# Patient Record
Sex: Male | Born: 1960
Health system: Southern US, Community
[De-identification: ages and names within clinical notes are randomized; demographics above are authoritative.]

## PROBLEM LIST (undated history)

## (undated) DIAGNOSIS — Z9889 Other specified postprocedural states: Secondary | ICD-10-CM

## (undated) DIAGNOSIS — R112 Nausea with vomiting, unspecified: Secondary | ICD-10-CM

## (undated) DIAGNOSIS — K4090 Unilateral inguinal hernia, without obstruction or gangrene, not specified as recurrent: Secondary | ICD-10-CM

## (undated) DIAGNOSIS — S83419A Sprain of medial collateral ligament of unspecified knee, initial encounter: Secondary | ICD-10-CM

## (undated) HISTORY — PX: INGUINAL HERNIA REPAIR: SUR1180

---

## 2002-07-26 ENCOUNTER — Ambulatory Visit (HOSPITAL_COMMUNITY): Admission: RE | Admit: 2002-07-26 | Discharge: 2002-07-26 | Payer: Self-pay | Admitting: Surgery

## 2011-07-27 ENCOUNTER — Inpatient Hospital Stay (INDEPENDENT_AMBULATORY_CARE_PROVIDER_SITE_OTHER)
Admission: RE | Admit: 2011-07-27 | Discharge: 2011-07-27 | Disposition: A | Discharge status: Home / Self Care | Payer: 59 | Source: Ambulatory Visit | Attending: Family Medicine | Admitting: Family Medicine

## 2011-07-27 DIAGNOSIS — IMO0002 Reserved for concepts with insufficient information to code with codable children: Secondary | ICD-10-CM

## 2016-01-30 DIAGNOSIS — R1909 Other intra-abdominal and pelvic swelling, mass and lump: Secondary | ICD-10-CM | POA: Diagnosis not present

## 2016-02-22 ENCOUNTER — Other Ambulatory Visit: Payer: Self-pay | Admitting: Surgery

## 2016-02-22 DIAGNOSIS — K4091 Unilateral inguinal hernia, without obstruction or gangrene, recurrent: Secondary | ICD-10-CM

## 2016-03-21 ENCOUNTER — Ambulatory Visit
Admission: RE | Admit: 2016-03-21 | Discharge: 2016-03-21 | Disposition: A | Discharge status: Home / Self Care | Payer: 59 | Source: Ambulatory Visit | Attending: Surgery | Admitting: Surgery

## 2016-03-21 DIAGNOSIS — K4090 Unilateral inguinal hernia, without obstruction or gangrene, not specified as recurrent: Secondary | ICD-10-CM | POA: Diagnosis not present

## 2016-03-21 DIAGNOSIS — K4091 Unilateral inguinal hernia, without obstruction or gangrene, recurrent: Secondary | ICD-10-CM

## 2016-03-21 MED ORDER — IOPAMIDOL (ISOVUE-300) INJECTION 61%
100.0000 mL | Freq: Once | INTRAVENOUS | Status: AC | PRN
  Administered 2016-03-21: 100 mL via INTRAVENOUS

## 2016-03-24 ENCOUNTER — Other Ambulatory Visit: Payer: Self-pay | Admitting: Surgery

## 2016-03-25 DIAGNOSIS — H5213 Myopia, bilateral: Secondary | ICD-10-CM | POA: Diagnosis not present

## 2016-04-16 ENCOUNTER — Other Ambulatory Visit: Payer: Self-pay | Admitting: Surgery

## 2016-05-01 ENCOUNTER — Other Ambulatory Visit: Payer: Self-pay

## 2016-05-01 ENCOUNTER — Encounter (HOSPITAL_COMMUNITY)
Admission: RE | Admit: 2016-05-01 | Discharge: 2016-05-01 | Disposition: A | Discharge status: Home / Self Care | Payer: 59 | Source: Ambulatory Visit | Attending: Surgery | Admitting: Surgery

## 2016-05-01 ENCOUNTER — Encounter (HOSPITAL_COMMUNITY): Payer: Self-pay

## 2016-05-01 DIAGNOSIS — K4090 Unilateral inguinal hernia, without obstruction or gangrene, not specified as recurrent: Secondary | ICD-10-CM | POA: Diagnosis not present

## 2016-05-01 DIAGNOSIS — Z01812 Encounter for preprocedural laboratory examination: Secondary | ICD-10-CM | POA: Diagnosis not present

## 2016-05-01 DIAGNOSIS — I4510 Unspecified right bundle-branch block: Secondary | ICD-10-CM | POA: Diagnosis not present

## 2016-05-01 DIAGNOSIS — Z01818 Encounter for other preprocedural examination: Secondary | ICD-10-CM | POA: Insufficient documentation

## 2016-05-01 HISTORY — DX: Other specified postprocedural states: R11.2

## 2016-05-01 HISTORY — DX: Unilateral inguinal hernia, without obstruction or gangrene, not specified as recurrent: K40.90

## 2016-05-01 HISTORY — DX: Sprain of medial collateral ligament of unspecified knee, initial encounter: S83.419A

## 2016-05-01 HISTORY — DX: Other specified postprocedural states: Z98.890

## 2016-05-01 LAB — CBC
HCT: 41.2 % (ref 39.0–52.0)
Hemoglobin: 13.4 g/dL (ref 13.0–17.0)
MCH: 29.6 pg (ref 26.0–34.0)
MCHC: 32.5 g/dL (ref 30.0–36.0)
MCV: 91.2 fL (ref 78.0–100.0)
PLATELETS: 229 10*3/uL (ref 150–400)
RBC: 4.52 MIL/uL (ref 4.22–5.81)
RDW: 12.2 % (ref 11.5–15.5)
WBC: 5.2 10*3/uL (ref 4.0–10.5)

## 2016-05-01 LAB — BASIC METABOLIC PANEL
Anion gap: 4 — ABNORMAL LOW (ref 5–15)
BUN: 13 mg/dL (ref 6–20)
CHLORIDE: 103 mmol/L (ref 101–111)
CO2: 30 mmol/L (ref 22–32)
CREATININE: 1.11 mg/dL (ref 0.61–1.24)
Calcium: 9.1 mg/dL (ref 8.9–10.3)
GFR calc non Af Amer: >60 mL/min (ref >60)
Glucose, Bld: 137 mg/dL — ABNORMAL HIGH (ref 65–99)
Potassium: 3.9 mmol/L (ref 3.5–5.1)
Sodium: 137 mmol/L (ref 135–145)

## 2016-05-01 NOTE — Pre-Procedure Instructions (Signed)
    Midge AverDaniel P Chamorro  05/01/2016      [facility] [place] - [address] [place] [id number] Phone: [phone] Fax: [phone]    Your procedure is scheduled on July 24  [facility] at 2 p.M.  Call this number if you have problems the morning of surgery:  [phone]   Remember:  Do not eat food or drink liquids after midnight.  Take these medicines the morning of surgery with A SIP OF WATER tylenol if needed   Do not wear jewelry.  Do not wear lotions, powders, or cologne.  You may NOT wear deoderant.  Men may shave face and neck.  Do not bring valuables to the hospital.  [facility] is not responsible for any belongings or valuables.  Contacts, dentures or bridgework may not be worn into surgery.  Leave your suitcase in the car.  After surgery it may be brought to your room.  For patients admitted to the hospital, discharge time will be determined by your treatment team.  Patients discharged the day of surgery will not be allowed to drive home.    Special instructions:   [place]- Preparing For Surgery  Before surgery, you can play an important role. Because skin is not sterile, your skin needs to be as free of germs as possible. You can reduce the number of germs on your skin by washing with CHG (chlorahexidine gluconate) Soap before surgery.  CHG is an antiseptic cleaner which kills germs and bonds with the skin to continue killing germs even after washing.  Please do not use if you have an allergy to CHG or antibacterial soaps. If your skin becomes reddened/irritated stop using the CHG.  Do not shave (including legs and underarms) for at least 48 hours prior to first CHG shower. It is OK to shave your face.  Please follow these instructions carefully.   1. Shower the NIGHT BEFORE SURGERY and the MORNING OF SURGERY with CHG.   2. If you chose to wash your hair, wash your hair first as  usual with your normal shampoo.  3. After you shampoo, rinse your hair and body thoroughly to remove the shampoo.  4. Use CHG as you would any other liquid soap. You can apply CHG directly to the skin and wash gently with a scrungie or a clean washcloth.   5. Apply the CHG Soap to your body ONLY FROM THE NECK DOWN.  Do not use on open wounds or open sores. Avoid contact with your eyes, ears, mouth and genitals (private parts). Wash genitals (private parts) with your normal soap.  6. Wash thoroughly, paying special attention to the area where your surgery will be performed.  7. Thoroughly rinse your body with warm water from the neck down.  8. DO NOT shower/wash with your normal soap after using and rinsing off the CHG Soap.  9. Pat yourself dry with a CLEAN TOWEL.   10. Wear CLEAN PAJAMAS   11. Place CLEAN SHEETS on your bed the night of your first shower and DO NOT SLEEP WITH PETS.    Day of Surgery: Do not apply any deodorants/lotions. Please wear clean clothes to the hospital/surgery center.      Please read over the following fact sheets that you were given. Pain Booklet

## 2016-05-01 NOTE — Progress Notes (Signed)
PCP - Merri BrunetteWalter Pharr Cardiologist - denies  Chest x-ray - not needed EKG - 05/01/16 Stress Test - denies ECHO - denies Cardiac Cath - denies   Patient had abnormal [facility] the [facility]. Repeating EKG today   Patient denies shortness of breath, fever, cough and chest pain at PAT appointment

## 2016-05-02 NOTE — Progress Notes (Signed)
Anesthesia Chart Review: Patient is a 55 year old male scheduled for left inguinal hernia repair with mesh on 05/05/16 by Dr. Abigail Miyamotoouglas Blackman.  History includes never smoker, post-operative N/V, bilateral inguinal hernia repair. PCP is Dr. Merri BrunetteWalter Pharr.   05/01/16 EKG: NSR, possible LAE, incomplete right BBB, LAD. No significant change when compared to 07/26/02 tracing (in [facility]).  Preoperative labs noted.   If no acute changes then I anticipate that he can proceed as planned.  Velna Ochsllison Levy Cedano, PA-C [facility]Anesthesiology Phone [phone] 05/02/2016 10:34 AM

## 2016-05-04 NOTE — H&P (Signed)
Johnny [name] March  Location: [facility] Patient #: [id number] DOB: 11-17-1960 Married / Language: English / Race: White Male   History of Present Illness Patient words: New-ing hernia.  The patient is a 55 year old male who presents with an inguinal hernia. This gentleman is referred by Dr. Salli [name] for evaluation of a possible recurrent left inguinal hernia. This gentleman has had multiple open right inguinal hernia repairs as a teenager. I saw him in 2003 for recurrent hernia. I performed bilateral laparoscopic inguinal hernia repair with mesh because at the time he also had a left inguinal hernia. He reports he isn't very well until several months ago when he noticed a recurrent bulge in the left inguinal area. Occasionally, he reports that it is difficult to reduce. He also has some mild discomfort on the right side. He is recently been coughing a lot and also was done heavy lifting.   Other Problems  Inguinal Hernia  Past Surgical History  Laparoscopic Inguinal Hernia Surgery Bilateral. Open Inguinal Hernia Surgery Right. multiple  Diagnostic Studies History Johnny [name], CMA;  Colonoscopy never  Allergies Johnny [name], CMA No Known Drug Allergies05/09/2016  Medication History Johnny [name], CMA;  No Current Medications Medications Reconciled  Social History Johnny [name], CMA;   Alcohol use Occasional alcohol use. Caffeine use Coffee. No drug use Tobacco use Never smoker.  Family History Johnny [name], CMA; Alcohol Abuse Brother. Heart Disease Mother. Heart disease in male family member before age 73 Migraine Headache Father.    Review of Systems   General Not Present- Appetite Loss, Chills, Fatigue, Fever, Night Sweats, Weight Gain and Weight Loss. HEENT Present- Wears glasses/contact lenses. Not Present- Earache, Hearing Loss, Hoarseness, Nose Bleed, Oral Ulcers, Ringing in the Ears, Seasonal Allergies, Sinus Pain, Sore  Throat, Visual Disturbances and Yellow Eyes. Respiratory Not Present- Bloody sputum, Chronic Cough, Difficulty Breathing, Snoring and Wheezing. Breast Not Present- Breast Mass, Breast Pain, Nipple Discharge and Skin Changes. Cardiovascular Not Present- Chest Pain, Difficulty Breathing Lying Down, Leg Cramps, Palpitations, Rapid Heart Rate, Shortness of Breath and Swelling of Extremities. Gastrointestinal Not Present- Abdominal Pain, Bloating, Bloody Stool, Change in Bowel Habits, Chronic diarrhea, Constipation, Difficulty Swallowing, Excessive gas, Gets full quickly at meals, Hemorrhoids, Indigestion, Nausea, Rectal Pain and Vomiting. Male Genitourinary Not Present- Blood in Urine, Change in Urinary Stream, Frequency, Impotence, Nocturia, Painful Urination, Urgency and Urine Leakage. Musculoskeletal Not Present- Back Pain, Joint Pain, Joint Stiffness, Muscle Pain, Muscle Weakness and Swelling of Extremities. Neurological Not Present- Decreased Memory, Fainting, Headaches, Numbness, Seizures, Tingling, Tremor, Trouble walking and Weakness. Psychiatric Not Present- Anxiety, Bipolar, Change in Sleep Pattern, Depression, Fearful and Frequent crying. Endocrine Not Present- Cold Intolerance, Excessive Hunger, Hair Changes, Heat Intolerance, Hot flashes and New Diabetes. Hematology Not Present- Easy Bruising, Excessive bleeding, Gland problems, HIV and Persistent Infections.  Vitals Weight: 181 lb Height: 74in Body Surface Area: 2.08 m Body Mass Index: 23.24 kg/m  Temp.: 98.66F(Oral)  Pulse: 63 (Regular)  BP: 100/64 (Sitting, Left Arm, Standard)  Physical Exam  The physical exam findings are as follows: Note:On examination, both groins feel weak. I can feel significant movement on the left but I cannot make the hernia stick out. He is tender in both groins. Lungs clear CV RRR Abdomen soft and non tender Generally well in appearance   Assessment & Plan  RECURRENT LEFT INGUINAL  HERNIA (K40.91)  Impression: I believe he at least has a recurrent left inguinal hernia. He may also have a right inguinal hernia.  I believe he needs a CAT scan of his abdomen and pelvis to evaluate the abdominal wall and see if the recurrence is on both sides or just the left prior to considering an open repair versus a laparoscopic repair, without a CAT scan, I would be uncertain as to which approach with suit him best. I will call him back with the results of the CAT scan Current Plans Follow Up - Call [facility] office after tests / studies done to discuss further plans  Addendum:  CT shows only a recurrent left inguinal hernia without right .  Will proceed with open left inguinal hernia repair with mesh.  I discussed the risks which include but are not limited to bleeding, infection, injury to surrounding structures, nerve entrapment chronic pain, recurrent hernia, etc.  He agrees to proceed.

## 2016-05-05 ENCOUNTER — Ambulatory Visit (HOSPITAL_COMMUNITY)
Admission: RE | Admit: 2016-05-05 | Discharge: 2016-05-05 | Disposition: A | Discharge status: Home / Self Care | Payer: 59 | Source: Ambulatory Visit | Attending: Surgery | Admitting: Surgery

## 2016-05-05 ENCOUNTER — Encounter (HOSPITAL_COMMUNITY)
Admission: RE | Disposition: A | Discharge status: Home / Self Care | Payer: Self-pay | Source: Ambulatory Visit | Attending: Surgery

## 2016-05-05 ENCOUNTER — Ambulatory Visit (HOSPITAL_COMMUNITY): Payer: 59 | Admitting: Anesthesiology

## 2016-05-05 ENCOUNTER — Encounter (HOSPITAL_COMMUNITY): Payer: Self-pay | Admitting: *Deleted

## 2016-05-05 ENCOUNTER — Ambulatory Visit (HOSPITAL_COMMUNITY): Payer: 59 | Admitting: Vascular Surgery

## 2016-05-05 DIAGNOSIS — K4041 Unilateral inguinal hernia, with gangrene, recurrent: Secondary | ICD-10-CM | POA: Diagnosis not present

## 2016-05-05 DIAGNOSIS — K4091 Unilateral inguinal hernia, without obstruction or gangrene, recurrent: Secondary | ICD-10-CM | POA: Diagnosis not present

## 2016-05-05 DIAGNOSIS — G8918 Other acute postprocedural pain: Secondary | ICD-10-CM | POA: Diagnosis not present

## 2016-05-05 HISTORY — PX: INGUINAL HERNIA REPAIR: SHX194

## 2016-05-05 HISTORY — PX: INSERTION OF MESH: SHX5868

## 2016-05-05 SURGERY — REPAIR, HERNIA, INGUINAL, ADULT
Anesthesia: General | Site: Groin | Laterality: Left

## 2016-05-05 MED ORDER — SCOPOLAMINE 1 MG/3DAYS TD PT72
MEDICATED_PATCH | TRANSDERMAL | Status: DC | PRN
  Administered 2016-05-05: 1 via TRANSDERMAL

## 2016-05-05 MED ORDER — HYDROMORPHONE HCL 1 MG/ML IJ SOLN
0.2500 mg | INTRAMUSCULAR | Status: DC | PRN
  Administered 2016-05-05 (×2): 0.5 mg via INTRAVENOUS

## 2016-05-05 MED ORDER — KETOROLAC TROMETHAMINE 30 MG/ML IJ SOLN
30.0000 mg | Freq: Once | INTRAMUSCULAR | Status: AC
  Administered 2016-05-05: 30 mg via INTRAVENOUS

## 2016-05-05 MED ORDER — ONDANSETRON HCL 4 MG/2ML IJ SOLN
INTRAMUSCULAR | Status: AC
  Filled 2016-05-05: qty 2

## 2016-05-05 MED ORDER — OXYCODONE-ACETAMINOPHEN 5-325 MG PO TABS: 1.0000 | ORAL_TABLET | ORAL | 0 refills | Status: AC | PRN

## 2016-05-05 MED ORDER — LACTATED RINGERS IV SOLN: INTRAVENOUS | Status: DC

## 2016-05-05 MED ORDER — FENTANYL CITRATE (PF) 250 MCG/5ML IJ SOLN
INTRAMUSCULAR | Status: AC
  Filled 2016-05-05: qty 5

## 2016-05-05 MED ORDER — PROMETHAZINE HCL 25 MG/ML IJ SOLN: 6.2500 mg | INTRAMUSCULAR | Status: DC | PRN

## 2016-05-05 MED ORDER — MEPERIDINE HCL 25 MG/ML IJ SOLN: 6.2500 mg | INTRAMUSCULAR | Status: DC | PRN

## 2016-05-05 MED ORDER — ACETAMINOPHEN 10 MG/ML IV SOLN
INTRAVENOUS | Status: AC
  Filled 2016-05-05: qty 100

## 2016-05-05 MED ORDER — BUPIVACAINE-EPINEPHRINE 0.5% -1:200000 IJ SOLN
INTRAMUSCULAR | Status: DC | PRN
  Administered 2016-05-05: 10 mL

## 2016-05-05 MED ORDER — KETOROLAC TROMETHAMINE 30 MG/ML IJ SOLN
INTRAMUSCULAR | Status: AC
  Filled 2016-05-05: qty 1

## 2016-05-05 MED ORDER — BUPIVACAINE-EPINEPHRINE (PF) 0.5% -1:200000 IJ SOLN
INTRAMUSCULAR | Status: AC
  Filled 2016-05-05: qty 30

## 2016-05-05 MED ORDER — LIDOCAINE 2% (20 MG/ML) 5 ML SYRINGE
INTRAMUSCULAR | Status: AC
  Filled 2016-05-05: qty 10

## 2016-05-05 MED ORDER — SCOPOLAMINE 1 MG/3DAYS TD PT72
MEDICATED_PATCH | TRANSDERMAL | Status: AC
  Filled 2016-05-05: qty 1

## 2016-05-05 MED ORDER — LACTATED RINGERS IV SOLN
INTRAVENOUS | Status: DC
  Administered 2016-05-05: 13:00:00 via INTRAVENOUS

## 2016-05-05 MED ORDER — BUPIVACAINE-EPINEPHRINE (PF) 0.5% -1:200000 IJ SOLN
INTRAMUSCULAR | Status: DC | PRN
  Administered 2016-05-05: 30 mL

## 2016-05-05 MED ORDER — DEXAMETHASONE SODIUM PHOSPHATE 10 MG/ML IJ SOLN
INTRAMUSCULAR | Status: DC | PRN
  Administered 2016-05-05: 10 mg via INTRAVENOUS

## 2016-05-05 MED ORDER — CEFAZOLIN SODIUM-DEXTROSE 2-4 GM/100ML-% IV SOLN
INTRAVENOUS | Status: AC
  Filled 2016-05-05: qty 100

## 2016-05-05 MED ORDER — MIDAZOLAM HCL 2 MG/2ML IJ SOLN
INTRAMUSCULAR | Status: AC
  Filled 2016-05-05: qty 2

## 2016-05-05 MED ORDER — DEXAMETHASONE SODIUM PHOSPHATE 10 MG/ML IJ SOLN
INTRAMUSCULAR | Status: AC
  Filled 2016-05-05: qty 1

## 2016-05-05 MED ORDER — FENTANYL CITRATE (PF) 100 MCG/2ML IJ SOLN
INTRAMUSCULAR | Status: DC | PRN
  Administered 2016-05-05: 50 ug via INTRAVENOUS

## 2016-05-05 MED ORDER — LIDOCAINE HCL (CARDIAC) 20 MG/ML IV SOLN
INTRAVENOUS | Status: DC | PRN
  Administered 2016-05-05: 60 mg via INTRAVENOUS

## 2016-05-05 MED ORDER — MIDAZOLAM HCL 5 MG/5ML IJ SOLN
INTRAMUSCULAR | Status: DC | PRN
  Administered 2016-05-05: 2 mg via INTRAVENOUS

## 2016-05-05 MED ORDER — 0.9 % SODIUM CHLORIDE (POUR BTL) OPTIME
TOPICAL | Status: DC | PRN
  Administered 2016-05-05: 1000 mL

## 2016-05-05 MED ORDER — BUPIVACAINE-EPINEPHRINE (PF) 0.25% -1:200000 IJ SOLN
INTRAMUSCULAR | Status: AC
  Filled 2016-05-05: qty 30

## 2016-05-05 MED ORDER — HYDROMORPHONE HCL 1 MG/ML IJ SOLN
INTRAMUSCULAR | Status: AC
  Filled 2016-05-05: qty 1

## 2016-05-05 MED ORDER — ACETAMINOPHEN 10 MG/ML IV SOLN
1000.0000 mg | Freq: Once | INTRAVENOUS | Status: AC
  Administered 2016-05-05: 1000 mg via INTRAVENOUS

## 2016-05-05 MED ORDER — PROPOFOL 10 MG/ML IV BOLUS
INTRAVENOUS | Status: DC | PRN
  Administered 2016-05-05: 200 mg via INTRAVENOUS

## 2016-05-05 MED ORDER — CEFAZOLIN SODIUM-DEXTROSE 2-4 GM/100ML-% IV SOLN
2.0000 g | INTRAVENOUS | Status: AC
  Administered 2016-05-05: 2 g via INTRAVENOUS

## 2016-05-05 MED ORDER — ONDANSETRON HCL 4 MG/2ML IJ SOLN
INTRAMUSCULAR | Status: DC | PRN
  Administered 2016-05-05: 4 mg via INTRAVENOUS

## 2016-05-05 MED FILL — OXYCODONE/APAP 5-325: 5-325 | 4 days supply | Qty: 40 | Fill #0

## 2016-05-05 SURGICAL SUPPLY — 37 items
BLADE SURG 10 STRL SS (BLADE) ×3 IMPLANT
BLADE SURG 15 STRL LF DISP TIS (BLADE) ×1 IMPLANT
BLADE SURG 15 STRL SS (BLADE) ×2
BLADE SURG ROTATE 9660 (MISCELLANEOUS) IMPLANT
CHLORAPREP W/TINT 26ML (MISCELLANEOUS) ×3 IMPLANT
COVER SURGICAL LIGHT HANDLE (MISCELLANEOUS) ×3 IMPLANT
DRAIN PENROSE 1/2X12 LTX STRL (WOUND CARE) ×3 IMPLANT
DRAPE LAPAROTOMY TRNSV 102X78 (DRAPE) ×3 IMPLANT
DRAPE UTILITY XL STRL (DRAPES) ×3 IMPLANT
ELECT CAUTERY BLADE 6.4 (BLADE) ×3 IMPLANT
ELECT REM PT RETURN 9FT ADLT (ELECTROSURGICAL) ×3
ELECTRODE REM PT RTRN 9FT ADLT (ELECTROSURGICAL) ×1 IMPLANT
GLOVE SURG SIGNA 7.5 PF LTX (GLOVE) ×3 IMPLANT
GOWN STRL REUS W/ TWL LRG LVL3 (GOWN DISPOSABLE) ×1 IMPLANT
GOWN STRL REUS W/ TWL XL LVL3 (GOWN DISPOSABLE) ×1 IMPLANT
GOWN STRL REUS W/TWL LRG LVL3 (GOWN DISPOSABLE) ×2
GOWN STRL REUS W/TWL XL LVL3 (GOWN DISPOSABLE) ×2
KIT BASIN OR (CUSTOM PROCEDURE TRAY) ×3 IMPLANT
KIT ROOM TURNOVER OR (KITS) ×3 IMPLANT
LIQUID BAND (GAUZE/BANDAGES/DRESSINGS) ×3 IMPLANT
MESH PARIETEX PROGRIP LEFT (Mesh General) ×3 IMPLANT
NEEDLE HYPO 25GX1X1/2 BEV (NEEDLE) ×3 IMPLANT
NS IRRIG 1000ML POUR BTL (IV SOLUTION) ×3 IMPLANT
PACK SURGICAL SETUP 50X90 (CUSTOM PROCEDURE TRAY) ×3 IMPLANT
PAD ARMBOARD 7.5X6 YLW CONV (MISCELLANEOUS) ×3 IMPLANT
PENCIL BUTTON HOLSTER BLD 10FT (ELECTRODE) ×3 IMPLANT
SPONGE LAP 18X18 X RAY DECT (DISPOSABLE) ×3 IMPLANT
SUT MNCRL AB 4-0 PS2 18 (SUTURE) ×3 IMPLANT
SUT MON AB 4-0 PC3 18 (SUTURE) ×3 IMPLANT
SUT SILK 2 0 SH (SUTURE) ×3 IMPLANT
SUT VIC AB 2-0 CT1 27 (SUTURE) ×6
SUT VIC AB 2-0 CT1 TAPERPNT 27 (SUTURE) ×3 IMPLANT
SUT VIC AB 3-0 CT1 27 (SUTURE) ×4
SUT VIC AB 3-0 CT1 TAPERPNT 27 (SUTURE) ×2 IMPLANT
SYR CONTROL 10ML LL (SYRINGE) ×3 IMPLANT
TOWEL OR 17X24 6PK STRL BLUE (TOWEL DISPOSABLE) ×3 IMPLANT
TOWEL OR 17X26 10 PK STRL BLUE (TOWEL DISPOSABLE) ×3 IMPLANT

## 2016-05-05 NOTE — Op Note (Signed)
NAME:  [name], Johnny NO.:  [id number]  MEDICAL RECORD NO.:  [id number]  LOCATION:  [facility]                         FACILITY:  [facility]  PHYSICIAN:  Abigail Miyamoto, M.D. DATE OF BIRTH:  [birth year]  DATE OF PROCEDURE:  05/05/2016 DATE OF DISCHARGE:  05/05/2016                              OPERATIVE REPORT   PREOPERATIVE DIAGNOSIS:  Recurrent left inguinal hernia.  POSTOPERATIVE DIAGNOSIS:  Recurrent left inguinal hernia.  PROCEDURE:  Repair of recurrent left inguinal hernia with mesh.  SURGEON:  Abigail Miyamoto, M.D.  ANESTHESIA:  General with 0.5% Marcaine and Tap block provided by Anesthesia.  ESTIMATED BLOOD LOSS:  Minimal.  INDICATIONS:  This is a 55 year old gentleman, who has had multiple inguinal hernia repairs.  He had laparoscopic bilateral inguinal hernia repair over the last time, but now he has a recurrent left inguinal hernia.  Decision was made to proceed with open repair with mesh.  FINDINGS:  The patient was found to have an indirect left inguinal hernia.  PROCEDURE IN DETAIL:  The patient was brought to the operating room, identified as [name].  He was placed supine on the operating table.  General anesthesia was induced.  A TAP block was provided by Anesthesia.  His abdomen was then prepped and draped in usual sterile fashion.  I anesthetized the skin on the left groin with Marcaine.  I then made a longitudinal incision with a scalpel.  I took this down through Scarpa's fascia with electrocautery.  The external oblique was then identified and opened towards the internal and external rings.  The testicular cord structures were then identified and controlled with Penrose drain.  There was omentum in the hernia sac which I reduced back into the abdominal cavity.  I then separated the indirect hernia sac from the rest of the cord structures and dissected down to the base.  I had to tie off a vein to the testicle with 3-0 silk suture.   I then tied off the base of sac with 2-0 silk suture and excised the sac with the cautery.  I then brought a piece of Prolene Proceed ProGrip mesh onto the field.  I placed it against the inguinal floor and sutured in place with a 2-0 Vicryl suture.  I then brought around the cord structures and secured it in place with another Vicryl suture.  Wide coverage of the inguinal floor and cord structures appeared to be achieved.  At this point, I closed the external oblique fascia over top of this with a running 2-0 Vicryl suture.  Scarpa's fascia was then closed with interrupted 3-0 Vicryl sutures. The skin was closed with a running 4-0 Monocryl.  Skin glue was then applied.  The patient tolerated the procedure well.  All sponge, needle, and instrument counts were correct at the end of procedure.  The patient was then extubated in the operating room and taken in stable condition to the recovery room.     Abigail Miyamoto, M.D.     DB/MEDQ  D:  05/05/2016  T:  05/05/2016  Job:  [id number]

## 2016-05-05 NOTE — Interval H&P Note (Signed)
History and Physical Interval Note:no change in H and P  05/05/2016 12:20 PM  Johnny [name]  has presented today for surgery, with the diagnosis of Recurrent left inguinal hernia  The various methods of treatment have been discussed with the patient and family. After consideration of risks, benefits and other options for treatment, the patient has consented to  Procedure(s): LEFT INGUINAL HERNIA REPAIR WITH MESH (Left) INSERTION OF MESH (Left) as a surgical intervention .  The patient's history has been reviewed, patient examined, no change in status, stable for surgery.  I have reviewed the patient's chart and labs.  Questions were answered to the patient's satisfaction.     Tekoa Hamor A

## 2016-05-05 NOTE — Anesthesia Procedure Notes (Signed)
Procedure Name: LMA Insertion Date/Time: 05/05/2016 12:58 PM Performed by: Sharlene Dory E Pre-anesthesia Checklist: Patient identified, Emergency Drugs available, Suction available, Patient being monitored and Timeout performed Patient Re-evaluated:Patient Re-evaluated prior to inductionOxygen Delivery Method: Circle system utilized Preoxygenation: Pre-oxygenation with 100% oxygen Intubation Type: IV induction LMA: LMA inserted LMA Size: 4.0 Number of attempts: 1 Placement Confirmation: positive ETCO2 and breath sounds checked- equal and bilateral Tube secured with: Tape Dental Injury: Teeth and Oropharynx as per pre-operative assessment

## 2016-05-05 NOTE — Op Note (Signed)
LEFT INGUINAL HERNIA REPAIR WITH MESH, INSERTION OF MESH  Procedure Note  Johnny [name] 05/05/2016   Pre-op Diagnosis: Recurrent left inguinal hernia     Post-op Diagnosis: same  Procedure(s): LEFT INGUINAL HERNIA REPAIR WITH MESH INSERTION OF MESH  Surgeon(s): Abigail Miyamoto, MD  Anesthesia: General  Staff:  Circulator: Christene Slates, RN Scrub Person: Royann Shivers, RN Circulator Assistant: Lowella Petties, RN  Estimated Blood Loss: Minimal                         [name]   Date: 05/05/2016  Time: 1:43 PM

## 2016-05-05 NOTE — Transfer of Care (Signed)
Immediate Anesthesia Transfer of Care Note  Patient: Johnny [name]  Procedure(s) Performed: Procedure(s): LEFT INGUINAL HERNIA REPAIR WITH MESH (Left) INSERTION OF MESH (Left)  Patient Location: PACU  Anesthesia Type:General  Level of Consciousness: awake, alert  and oriented  Airway & Oxygen Therapy: Patient Spontanous Breathing and Patient connected to nasal cannula oxygen  Post-op Assessment: Report given to RN, Post -op Vital signs reviewed and stable and Patient moving all extremities X 4  Post vital signs: Reviewed and stable  Last Vitals:  Vitals:   05/05/16 1220 05/05/16 1351  BP: (!) 144/85   Pulse: 77   Resp: 20   Temp: 36.5 C 36.4 C    Last Pain:  Vitals:   05/05/16 1220  TempSrc: Oral         Complications: No apparent anesthesia complications

## 2016-05-05 NOTE — Anesthesia Preprocedure Evaluation (Addendum)
Anesthesia Evaluation  Patient identified by MRN, date of birth, ID band Patient awake    Reviewed: Allergy & Precautions, NPO status , Patient's Chart, lab work & pertinent test results  History of Anesthesia Complications (+) PONV and history of anesthetic complications  Airway Mallampati: II       Dental  (+) Teeth Intact   Pulmonary neg pulmonary ROS,    breath sounds clear to auscultation       Cardiovascular negative cardio ROS   Rhythm:Regular Rate:Normal     Neuro/Psych negative neurological ROS  negative psych ROS   GI/Hepatic negative GI ROS, Neg liver ROS,   Endo/Other  negative endocrine ROS  Renal/GU negative Renal ROS  negative genitourinary   Musculoskeletal negative musculoskeletal ROS (+)   Abdominal Normal abdominal exam  (+)   Peds negative pediatric ROS (+)  Hematology negative hematology ROS (+)   Anesthesia Other Findings   Reproductive/Obstetrics negative OB ROS                           Lab Results  Component Value Date   WBC 5.2 05/01/2016   HGB 13.4 05/01/2016   HCT 41.2 05/01/2016   MCV 91.2 05/01/2016   PLT 229 05/01/2016   Lab Results  Component Value Date   CREATININE 1.11 05/01/2016   BUN 13 05/01/2016   NA 137 05/01/2016   K 3.9 05/01/2016   CL 103 05/01/2016   CO2 30 05/01/2016   No results found for: INR, PROTIME   Anesthesia Physical Anesthesia Plan  ASA: I  Anesthesia Plan: General   Post-op Pain Management: GA combined w/ Regional for post-op pain   Induction: Intravenous  Airway Management Planned: Oral ETT  Additional Equipment:   Intra-op Plan:   Post-operative Plan: Extubation in OR  Informed Consent: I have reviewed the patients History and Physical, chart, labs and discussed the procedure including the risks, benefits and alternatives for the proposed anesthesia with the patient or authorized representative who has  indicated his/her understanding and acceptance.     Plan Discussed with: CRNA  Anesthesia Plan Comments:        Anesthesia Quick Evaluation

## 2016-05-05 NOTE — Anesthesia Procedure Notes (Addendum)
Anesthesia Regional Block:  TAP block  Pre-Anesthetic Checklist: ,, timeout performed, Correct Patient, Correct Site, Correct Laterality, Correct Procedure, Correct Position, site marked, Risks and benefits discussed,  Surgical consent,  Pre-op evaluation,  At surgeon's request and post-op pain management  Laterality: Left  Prep: chloraprep       Needles:  Injection technique: Single-shot  Needle Type: Echogenic Needle     Needle Length: 9cm 9 cm Needle Gauge: 21 and 21 G    Additional Needles:  Procedures: ultrasound guided (picture in chart) TAP block Narrative:  Start time: 05/05/2016 1:04 PM End time: 05/05/2016 1:07 PM Injection made incrementally with aspirations every 5 mL.  Performed by: Personally  Anesthesiologist: Shona Simpson D  Additional Notes: No immediate complications noted.

## 2016-05-05 NOTE — Discharge Instructions (Signed)
[facility] _______[facility] ° °UMBILICAL OR INGUINAL HERNIA REPAIR: POST OP INSTRUCTIONS ° °Always review your discharge instruction sheet given to you by the facility where your surgery was performed. °IF YOU HAVE DISABILITY OR FAMILY LEAVE FORMS, YOU MUST BRING THEM TO THE OFFICE FOR PROCESSING.   °DO NOT GIVE THEM TO YOUR DOCTOR. ° °1. A  prescription for pain medication may be given to you upon discharge.  Take your pain medication as prescribed, if needed.  If narcotic pain medicine is not needed, then you may take acetaminophen (Tylenol) or ibuprofen (Advil) as needed. °2. Take your usually prescribed medications unless otherwise directed. °3. If you need a refill on your pain medication, please contact your pharmacy.  They will contact our office to request authorization. Prescriptions will not be filled after 5 pm or on week-ends. °4. You should follow a light diet the first 24 hours after arrival home, such as soup and crackers, etc.  Be sure to include lots of fluids daily.  Resume your normal diet the day after surgery. °5. Most patients will experience some swelling and bruising around the umbilicus or in the groin and scrotum.  Ice packs and reclining will help.  Swelling and bruising can take several days to resolve.  °6. It is common to experience some constipation if taking pain medication after surgery.  Increasing fluid intake and taking a stool softener (such as Colace) will usually help or prevent this problem from occurring.  A mild laxative (Milk of Magnesia or Miralax) should be taken according to package directions if there are no bowel movements after 48 hours. °7. Unless discharge instructions indicate otherwise, you may remove your bandages 24-48 hours after surgery, and you may shower at that time.  You may have steri-strips (small skin tapes) in place directly over the incision.  These strips should be left on the skin for 7-10 days.  If your surgeon used skin glue on the  incision, you may shower in 24 hours.  The glue will flake off over the next 2-3 weeks.  Any sutures or staples will be removed at the office during your follow-up visit. °8. ACTIVITIES:  You may resume regular (light) daily activities beginning the next day--such as daily self-care, walking, climbing stairs--gradually increasing activities as tolerated.  You may have sexual intercourse when it is comfortable.  Refrain from any heavy lifting or straining until approved by your doctor. °a. You may drive when you are no longer taking prescription pain medication, you can comfortably wear a seatbelt, and you can safely maneuver your car and apply brakes. °b. RETURN TO WORK:  __________________________________________________________ °9. You should see your doctor in the office for a follow-up appointment approximately 2-3 weeks after your surgery.  Make sure that you call for this appointment within a day or two after you arrive home to insure a convenient appointment time. °10. OTHER INSTRUCTIONS: NO LIFTING MORE THAN 15 POUNDS FOR 4 WEEKS __________________________________________________________________________________________________________________________________________________________________________________________  °WHEN TO CALL YOUR DOCTOR: °1. Fever over 101.0 °2. Inability to urinate °3. Nausea and/or vomiting °4. Extreme swelling or bruising °5. Continued bleeding from incision. °6. Increased pain, redness, or drainage from the incision ° °The clinic staff is available to answer your questions during regular business hours.  Please don’t hesitate to call and ask to speak to one of the nurses for clinical concerns.  If you have a medical emergency, go to the nearest emergency room or call 911.  A surgeon from [facility] is   always on call at the hospital ° ° °[address], [address], [place], [place]  [address] ? ° [address] [address], [place], [place]   [address] °[phone] ? [phone] ?  FAX [phone] °Web site: [email or web address] °

## 2016-05-06 ENCOUNTER — Encounter (HOSPITAL_COMMUNITY): Payer: Self-pay | Admitting: Surgery

## 2016-05-06 NOTE — Anesthesia Postprocedure Evaluation (Signed)
Anesthesia Post Note  Patient: [name]  Procedure(s) Performed: Procedure(s) (LRB): LEFT INGUINAL HERNIA REPAIR WITH MESH (Left) INSERTION OF MESH (Left)  Patient location during evaluation: PACU Anesthesia Type: General and Regional Level of consciousness: awake and alert Pain management: pain level controlled Vital Signs Assessment: post-procedure vital signs reviewed and stable Respiratory status: spontaneous breathing, nonlabored ventilation, respiratory function stable and patient connected to nasal cannula oxygen Cardiovascular status: blood pressure returned to baseline and stable Postop Assessment: no signs of nausea or vomiting Anesthetic complications: no    Last Vitals:  Vitals:   05/05/16 1615 05/05/16 1622  BP:  123/77  Pulse:  (!) 54  Resp:  14  Temp: 36.4 C     Last Pain:  Vitals:   05/05/16 1622  TempSrc:   PainSc: 3                  Shelton Silvas

## 2017-06-14 IMAGING — CT CT ABD-PELV W/ CM
3 of 5 series · 12 of 36 positions shown, 18 images · IV contrast (READICAT/WATER & [ID] ISOVUE 300)
Comparison: None.

CLINICAL DATA: 54-year-old with clinical recurrence of a left
inguinal hernia. Prior right inguinal hernia repair x4 and left
inguinal hernia repair x1.

EXAM:
CT ABDOMEN AND PELVIS WITH CONTRAST
TECHNIQUE: Multidetector CT imaging of the abdomen and pelvis was performed
using the standard protocol following bolus administration of
intravenous contrast.
CONTRAST:  100mL [id number] IOPAMIDOL ([id number]) INJECTION 61%.
Oral contrast was also administered.

[Series 3: abd/pelvis with · axial · 0.74mm/px · z∈[-341,+14]mm · 8 of 93 slices shown, 13 images]
[im 11/93  soft-tissue]
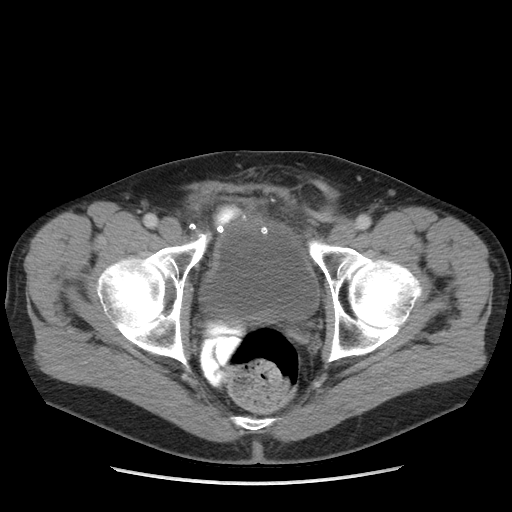
[im 11/93  bone]
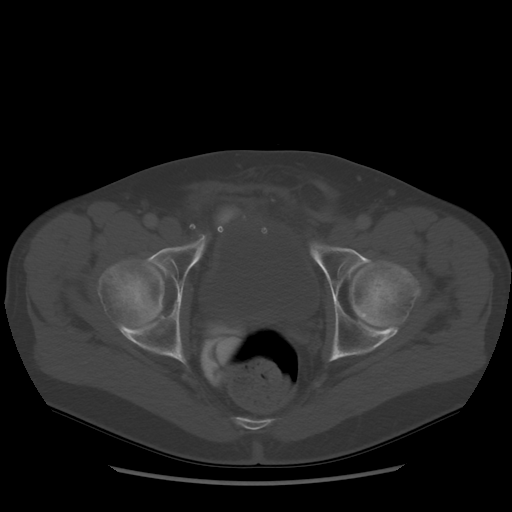
[im 21/93  soft-tissue]
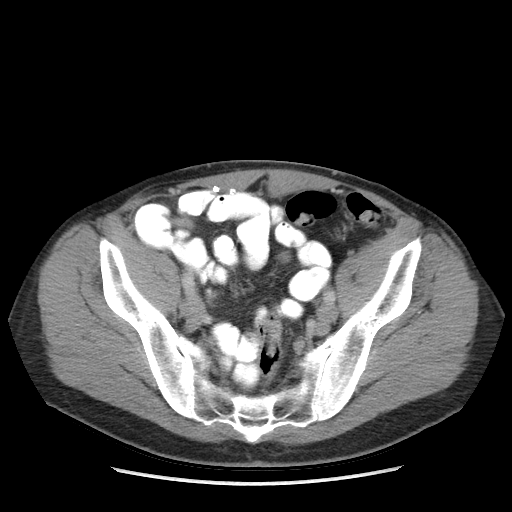
[im 31/93  soft-tissue]
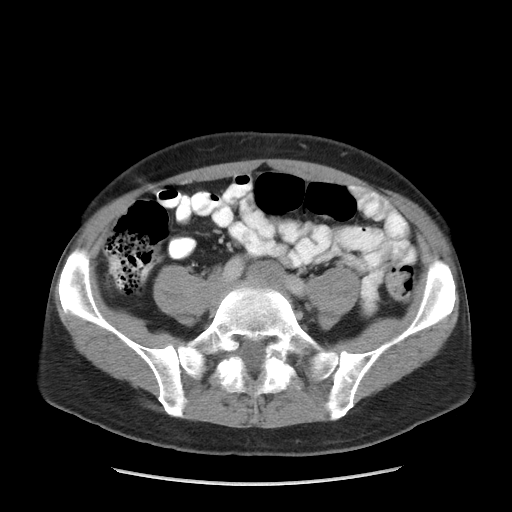
[im 41/93  soft-tissue]
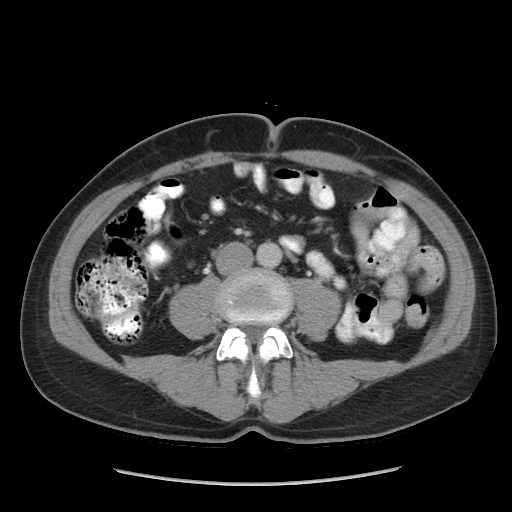
[im 52/93  soft-tissue]
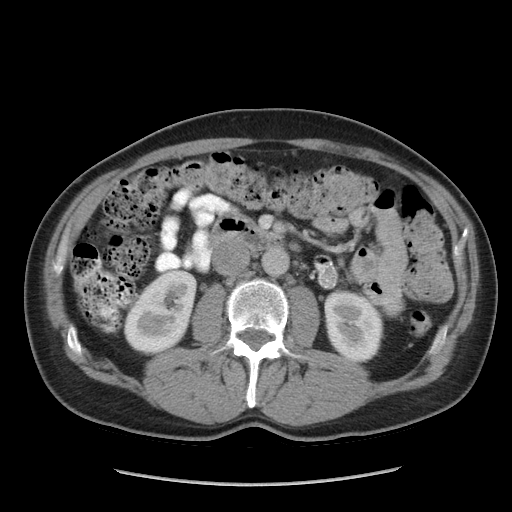
[im 52/93  lung]
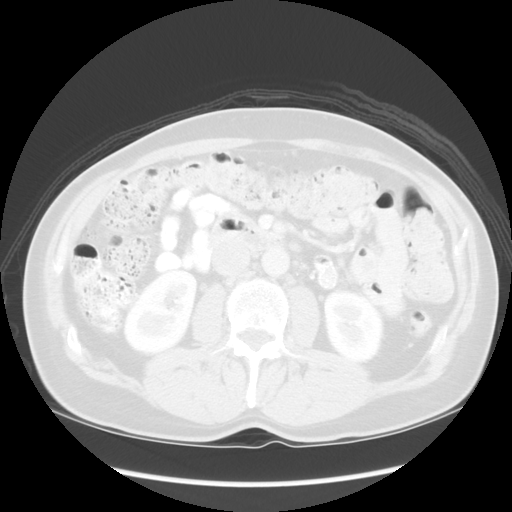
[im 62/93  soft-tissue]
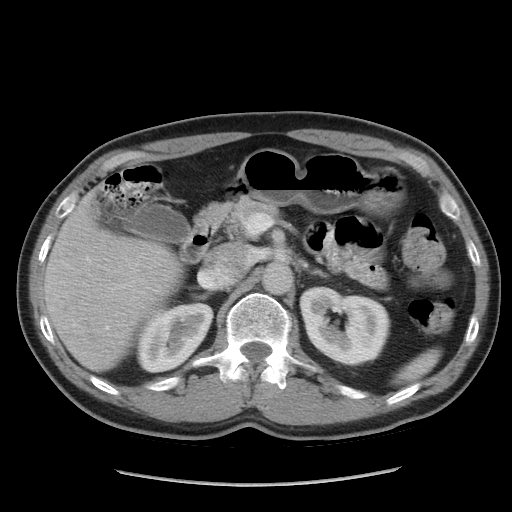
[im 62/93  lung]
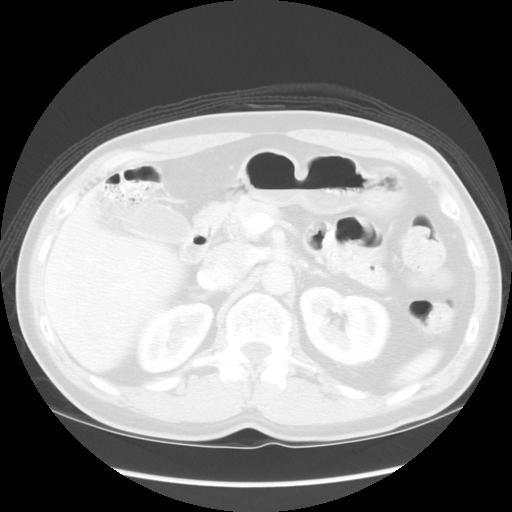
[im 72/93  soft-tissue]
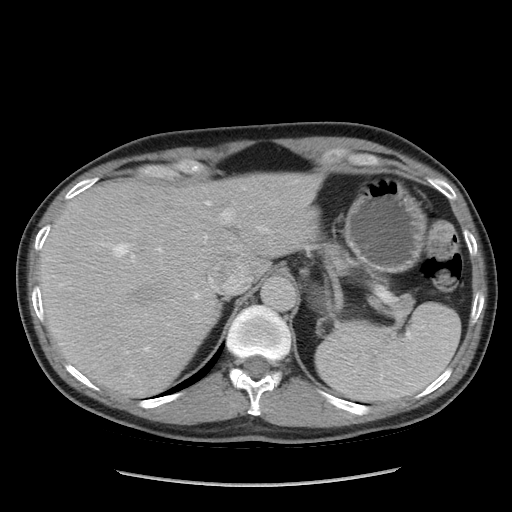
[im 72/93  lung]
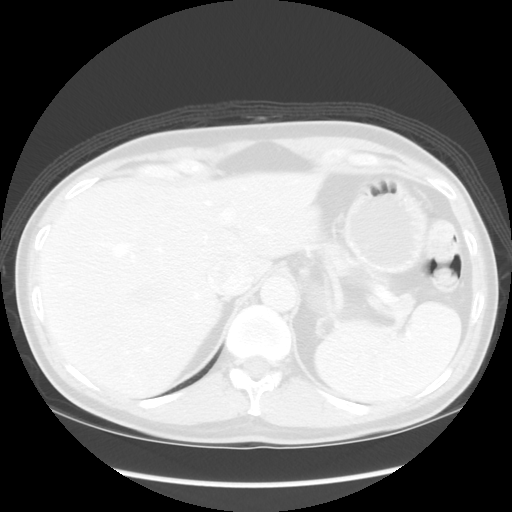
[im 82/93  soft-tissue]
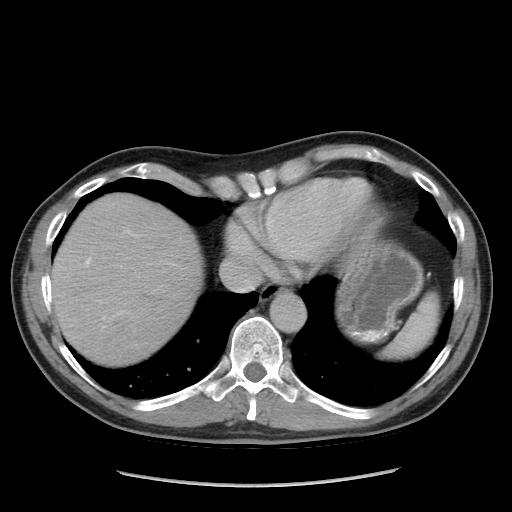
[im 82/93  lung]
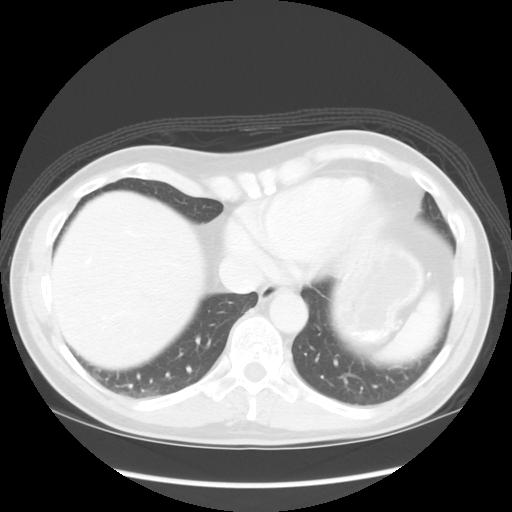

[Series 601: coronal body · coronal · 0.98mm/px · 1 of 110 slices shown, 2 images]
[im 37/110  soft-tissue]
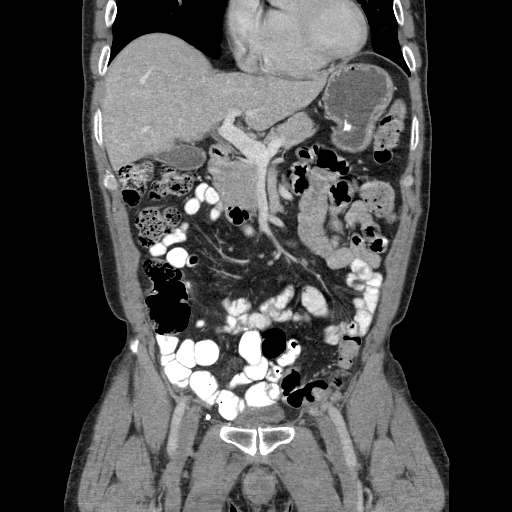
[im 37/110  bone]
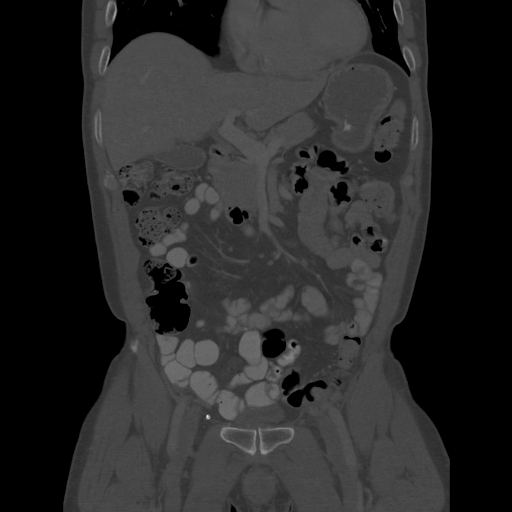

[Series 602: sagittal body · sagittal · 0.98mm/px · 3 of 153 slices shown]
[im 11/153  soft-tissue]
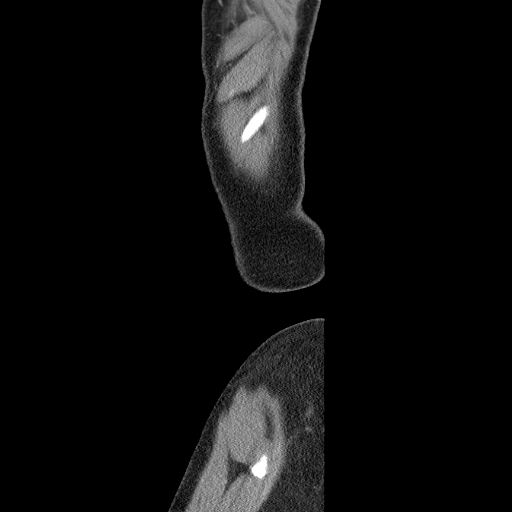
[im 31/153  soft-tissue]
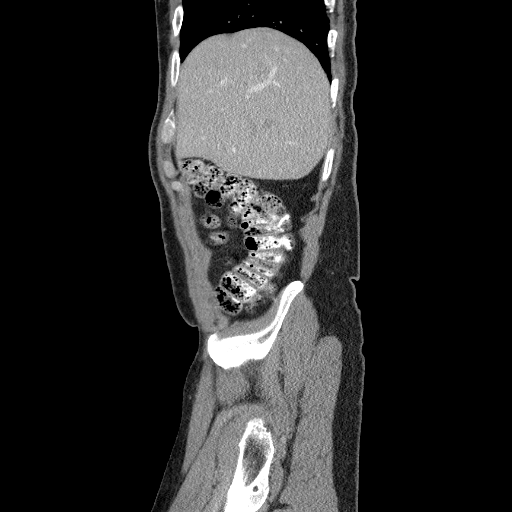
[im 51/153  soft-tissue]
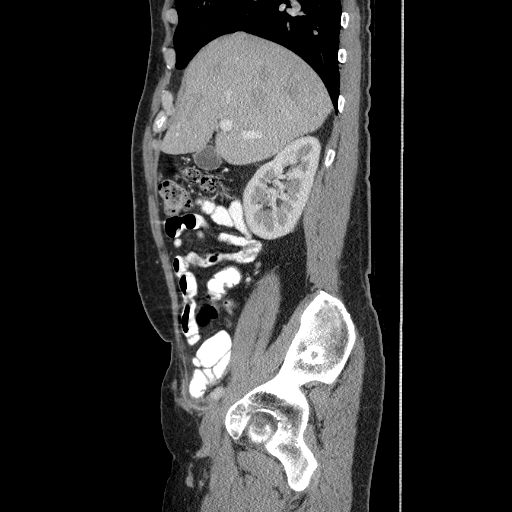

[12 of 36 positions shown; findings below may reference images not displayed]

FINDINGS: Lower chest: Heart size normal. Visualized lung bases clear. Note is
made of a pectus excavatum sternal deformity.

Hepatobiliary: Liver normal in size and appearance. Gallbladder
normal in appearance without calcified gallstones. No biliary ductal
dilation.

Pancreas: Normal in appearance without evidence of mass, ductal
dilation, or inflammation.

Spleen: Normal in size and appearance. Very small focus of accessory
splenic tissue anterior to the spleen at the hilum.

Adrenals/Urinary Tract: Normal appearing adrenal glands. Kidneys
normal in size and appearance without focal parenchymal abnormality.
No evidence of urinary tract calculi or obstruction.
Normal-appearing urinary bladder.

Stomach/Bowel: Stomach normal in appearance for the degree of
distention. Normal-appearing small bowel. Moderate stool burden
throughout the colon. Scattered descending and sigmoid colon
diverticula without evidence of acute diverticulitis.
Normal-appearing decompressed appendix in the right upper pelvis.

Vascular/Lymphatic: Mild left common iliac artery atherosclerosis
without visible atherosclerosis elsewhere. Normal-appearing portal
venous and systemic venous systems. No pathologic lymphadenopathy.

Reproductive: Prostate gland and seminal vesicles normal in size and
appearance for age.

Other: Moderate-sized left inguinal hernia containing fat. Surgical
mesh material overlying the right inguinal region with no evidence
of recurrence of the right inguinal hernia.

Musculoskeletal: Bilateral L5 spondylolysis with minimal grade 1
spondylolisthesis of L5 on S1. No acute abnormality.
IMPRESSION: 1. Moderate size left inguinal hernia containing fat.
2. No evidence of recurrent right inguinal hernia with surgical mesh
in place.
3. No acute abnormalities involving the abdomen or pelvis.
4. Scattered descending and sigmoid colon diverticula without
evidence of acute diverticulitis.
5. Bilateral L5 pars defects with minimal grade 1 spondylolisthesis
of L5 on S1.

## 2019-08-01 ENCOUNTER — Telehealth: Payer: Self-pay | Admitting: Pulmonary Disease

## 2019-08-03 NOTE — Telephone Encounter (Signed)
Error

## 2021-10-02 DIAGNOSIS — F4323 Adjustment disorder with mixed anxiety and depressed mood: Secondary | ICD-10-CM | POA: Diagnosis not present

## 2021-10-14 DIAGNOSIS — F4323 Adjustment disorder with mixed anxiety and depressed mood: Secondary | ICD-10-CM | POA: Diagnosis not present

## 2021-11-25 DIAGNOSIS — F4323 Adjustment disorder with mixed anxiety and depressed mood: Secondary | ICD-10-CM | POA: Diagnosis not present

## 2022-02-03 DIAGNOSIS — F4323 Adjustment disorder with mixed anxiety and depressed mood: Secondary | ICD-10-CM | POA: Diagnosis not present

## 2022-02-18 DIAGNOSIS — F4323 Adjustment disorder with mixed anxiety and depressed mood: Secondary | ICD-10-CM | POA: Diagnosis not present

## 2022-03-03 DIAGNOSIS — F4323 Adjustment disorder with mixed anxiety and depressed mood: Secondary | ICD-10-CM | POA: Diagnosis not present

## 2022-03-17 DIAGNOSIS — F4323 Adjustment disorder with mixed anxiety and depressed mood: Secondary | ICD-10-CM | POA: Diagnosis not present

## 2022-04-07 DIAGNOSIS — F4323 Adjustment disorder with mixed anxiety and depressed mood: Secondary | ICD-10-CM | POA: Diagnosis not present

## 2022-04-21 DIAGNOSIS — F4323 Adjustment disorder with mixed anxiety and depressed mood: Secondary | ICD-10-CM | POA: Diagnosis not present

## 2022-05-20 DIAGNOSIS — F4323 Adjustment disorder with mixed anxiety and depressed mood: Secondary | ICD-10-CM | POA: Diagnosis not present

## 2022-06-09 DIAGNOSIS — F4323 Adjustment disorder with mixed anxiety and depressed mood: Secondary | ICD-10-CM | POA: Diagnosis not present

## 2022-06-23 DIAGNOSIS — F4323 Adjustment disorder with mixed anxiety and depressed mood: Secondary | ICD-10-CM | POA: Diagnosis not present

## 2022-07-07 DIAGNOSIS — F4323 Adjustment disorder with mixed anxiety and depressed mood: Secondary | ICD-10-CM | POA: Diagnosis not present

## 2022-07-18 DIAGNOSIS — Z125 Encounter for screening for malignant neoplasm of prostate: Secondary | ICD-10-CM | POA: Diagnosis not present

## 2022-07-18 DIAGNOSIS — Z0000 Encounter for general adult medical examination without abnormal findings: Secondary | ICD-10-CM | POA: Diagnosis not present

## 2022-07-24 ENCOUNTER — Other Ambulatory Visit: Payer: Self-pay | Admitting: Registered Nurse

## 2022-07-24 ENCOUNTER — Other Ambulatory Visit (HOSPITAL_BASED_OUTPATIENT_CLINIC_OR_DEPARTMENT_OTHER): Payer: Self-pay | Admitting: Registered Nurse

## 2022-07-24 DIAGNOSIS — Z0000 Encounter for general adult medical examination without abnormal findings: Secondary | ICD-10-CM | POA: Diagnosis not present

## 2022-07-24 DIAGNOSIS — I499 Cardiac arrhythmia, unspecified: Secondary | ICD-10-CM | POA: Diagnosis not present

## 2022-07-24 DIAGNOSIS — Z23 Encounter for immunization: Secondary | ICD-10-CM | POA: Diagnosis not present

## 2022-08-06 DIAGNOSIS — F4323 Adjustment disorder with mixed anxiety and depressed mood: Secondary | ICD-10-CM | POA: Diagnosis not present

## 2022-08-18 DIAGNOSIS — F4323 Adjustment disorder with mixed anxiety and depressed mood: Secondary | ICD-10-CM | POA: Diagnosis not present

## 2022-09-01 DIAGNOSIS — F4323 Adjustment disorder with mixed anxiety and depressed mood: Secondary | ICD-10-CM | POA: Diagnosis not present

## 2022-09-15 DIAGNOSIS — F4323 Adjustment disorder with mixed anxiety and depressed mood: Secondary | ICD-10-CM | POA: Diagnosis not present

## 2022-09-29 DIAGNOSIS — F4323 Adjustment disorder with mixed anxiety and depressed mood: Secondary | ICD-10-CM | POA: Diagnosis not present

## 2022-10-14 DIAGNOSIS — F4323 Adjustment disorder with mixed anxiety and depressed mood: Secondary | ICD-10-CM | POA: Diagnosis not present

## 2023-07-24 DIAGNOSIS — Z125 Encounter for screening for malignant neoplasm of prostate: Secondary | ICD-10-CM | POA: Diagnosis not present

## 2023-07-24 DIAGNOSIS — R739 Hyperglycemia, unspecified: Secondary | ICD-10-CM | POA: Diagnosis not present

## 2023-07-24 DIAGNOSIS — Z0000 Encounter for general adult medical examination without abnormal findings: Secondary | ICD-10-CM | POA: Diagnosis not present

## 2023-07-31 ENCOUNTER — Other Ambulatory Visit (HOSPITAL_BASED_OUTPATIENT_CLINIC_OR_DEPARTMENT_OTHER): Payer: Self-pay | Admitting: Registered Nurse

## 2023-07-31 DIAGNOSIS — Z0000 Encounter for general adult medical examination without abnormal findings: Secondary | ICD-10-CM | POA: Diagnosis not present

## 2023-07-31 DIAGNOSIS — N5089 Other specified disorders of the male genital organs: Secondary | ICD-10-CM | POA: Diagnosis not present

## 2023-07-31 DIAGNOSIS — Z23 Encounter for immunization: Secondary | ICD-10-CM | POA: Diagnosis not present

## 2023-07-31 DIAGNOSIS — R7303 Prediabetes: Secondary | ICD-10-CM | POA: Diagnosis not present

## 2023-08-10 ENCOUNTER — Encounter: Payer: Self-pay | Admitting: Registered Nurse

## 2023-08-18 ENCOUNTER — Other Ambulatory Visit: Payer: Self-pay | Admitting: Registered Nurse

## 2023-08-18 DIAGNOSIS — N5089 Other specified disorders of the male genital organs: Secondary | ICD-10-CM

## 2023-08-25 ENCOUNTER — Inpatient Hospital Stay
Admission: RE | Admit: 2023-08-25 | Discharge: 2023-08-25 | Discharge status: Home / Self Care | Payer: Commercial Managed Care - PPO | Source: Ambulatory Visit | Attending: Registered Nurse | Admitting: Registered Nurse

## 2023-08-25 DIAGNOSIS — N5089 Other specified disorders of the male genital organs: Secondary | ICD-10-CM

## 2023-08-25 DIAGNOSIS — N433 Hydrocele, unspecified: Secondary | ICD-10-CM | POA: Diagnosis not present

## 2023-10-19 DIAGNOSIS — N430 Encysted hydrocele: Secondary | ICD-10-CM | POA: Diagnosis not present

## 2023-11-30 DIAGNOSIS — J101 Influenza due to other identified influenza virus with other respiratory manifestations: Secondary | ICD-10-CM | POA: Diagnosis not present

## 2023-11-30 DIAGNOSIS — R051 Acute cough: Secondary | ICD-10-CM | POA: Diagnosis not present

## 2023-11-30 DIAGNOSIS — J028 Acute pharyngitis due to other specified organisms: Secondary | ICD-10-CM | POA: Diagnosis not present

## 2023-11-30 DIAGNOSIS — R0981 Nasal congestion: Secondary | ICD-10-CM | POA: Diagnosis not present

## 2024-07-28 DIAGNOSIS — Z125 Encounter for screening for malignant neoplasm of prostate: Secondary | ICD-10-CM | POA: Diagnosis not present

## 2024-07-28 DIAGNOSIS — R739 Hyperglycemia, unspecified: Secondary | ICD-10-CM | POA: Diagnosis not present

## 2024-08-22 DIAGNOSIS — Z23 Encounter for immunization: Secondary | ICD-10-CM | POA: Diagnosis not present

## 2024-08-22 DIAGNOSIS — Z0000 Encounter for general adult medical examination without abnormal findings: Secondary | ICD-10-CM | POA: Diagnosis not present

## 2024-09-19 DIAGNOSIS — Z1212 Encounter for screening for malignant neoplasm of rectum: Secondary | ICD-10-CM | POA: Diagnosis not present

## 2024-09-19 DIAGNOSIS — Z1211 Encounter for screening for malignant neoplasm of colon: Secondary | ICD-10-CM | POA: Diagnosis not present
